# Patient Record
Sex: Male | Born: 1990 | Race: Black or African American | Hispanic: No | Marital: Single | Smoking: Former smoker
Health system: Southern US, Community
[De-identification: ages and names within clinical notes are randomized; demographics above are authoritative.]

## PROBLEM LIST (undated history)

## (undated) HISTORY — PX: WISDOM TOOTH EXTRACTION: SHX21

---
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
## 2013-01-16 ENCOUNTER — Emergency Department (HOSPITAL_BASED_OUTPATIENT_CLINIC_OR_DEPARTMENT_OTHER)
Admission: EM | Admit: 2013-01-16 | Discharge: 2013-01-16 | Disposition: A | Discharge status: Home / Self Care | Payer: Medicaid Other | Attending: Emergency Medicine | Admitting: Emergency Medicine

## 2013-01-16 ENCOUNTER — Encounter (HOSPITAL_BASED_OUTPATIENT_CLINIC_OR_DEPARTMENT_OTHER): Payer: Self-pay | Admitting: *Deleted

## 2013-01-16 DIAGNOSIS — Z8619 Personal history of other infectious and parasitic diseases: Secondary | ICD-10-CM | POA: Insufficient documentation

## 2013-01-16 DIAGNOSIS — N342 Other urethritis: Secondary | ICD-10-CM | POA: Insufficient documentation

## 2013-01-16 DIAGNOSIS — F17200 Nicotine dependence, unspecified, uncomplicated: Secondary | ICD-10-CM | POA: Insufficient documentation

## 2013-01-16 LAB — URINALYSIS, ROUTINE W REFLEX MICROSCOPIC
Bilirubin Urine: NEGATIVE
Glucose, UA: NEGATIVE mg/dL
Ketones, ur: NEGATIVE mg/dL
Nitrite: NEGATIVE
Protein, ur: NEGATIVE mg/dL
Specific Gravity, Urine: 1.024 (ref 1.005–1.030)
Urobilinogen, UA: 1 mg/dL (ref 0.0–1.0)
pH: 7 (ref 5.0–8.0)

## 2013-01-16 LAB — URINE MICROSCOPIC-ADD ON

## 2013-01-16 MED ORDER — LIDOCAINE HCL (PF) 1 % IJ SOLN
INTRAMUSCULAR | Status: AC
  Administered 2013-01-16: 0.9 mL
  Filled 2013-01-16: qty 5

## 2013-01-16 MED ORDER — CEFTRIAXONE SODIUM 250 MG IJ SOLR
250.0000 mg | Freq: Once | INTRAMUSCULAR | Status: AC
  Administered 2013-01-16: 250 mg via INTRAMUSCULAR
  Filled 2013-01-16: qty 250

## 2013-01-16 MED ORDER — AZITHROMYCIN 250 MG PO TABS
1000.0000 mg | ORAL_TABLET | Freq: Once | ORAL | Status: AC
  Administered 2013-01-16: 1000 mg via ORAL
  Filled 2013-01-16: qty 4

## 2013-01-16 NOTE — ED Provider Notes (Signed)
  CSN: [id number]     Arrival date & time 01/16/13  1703 History     First MD Initiated Contact with Patient 01/16/13 1720     No chief complaint on file.  (Consider location/radiation/quality/duration/timing/severity/associated sxs/prior Treatment) HPI Comments: Pt states that he had white discharge that started last night:pt has a history of chlamydia:pt denies fever, abdominal pain  The history is provided by the patient. No language interpreter was used.    History reviewed. No pertinent past medical history. History reviewed. No pertinent past surgical history. No family history on file. History  Substance Use Topics  . Smoking status: Current Every Day Smoker -- 0.50 packs/day    Types: Cigarettes  . Smokeless tobacco: Not on file  . Alcohol Use: Yes    Review of Systems  Constitutional: Negative.   Respiratory: Negative.   Cardiovascular: Negative.     Allergies  Review of patient's allergies indicates no known allergies.  Home Medications  No current outpatient prescriptions on file. BP 148/69  Pulse 52  Temp(Src) 98.6 F (37 C) (Oral)  Resp 20  Ht 5\' 11"  (1.803 m)  Wt 201 lb (91.173 kg)  BMI 28.05 kg/m2  SpO2 100% Physical Exam  Nursing note and vitals reviewed. Constitutional: He appears well-developed and well-nourished.  Cardiovascular: Normal rate and regular rhythm.   Pulmonary/Chest: Effort normal and breath sounds normal.  Genitourinary:  No discharge noted as pt has already urinated  Musculoskeletal: Normal range of motion.  Neurological: He is alert.    ED Course   Procedures (including critical care time)  Labs Reviewed  URINALYSIS, ROUTINE W REFLEX MICROSCOPIC - Abnormal; Notable for the following:    APPearance CLOUDY (*)    Hgb urine dipstick SMALL (*)    Leukocytes, UA LARGE (*)    All other components within normal limits  URINE MICROSCOPIC-ADD ON - Abnormal; Notable for the following:    Bacteria, UA FEW (*)    All other  components within normal limits  GC/CHLAMYDIA PROBE AMP  URINE CULTURE   No results found. 1. Urethritis     MDM  Pt treated for std  Teressa Lower, NP 01/16/13 1811

## 2013-01-16 NOTE — ED Notes (Signed)
Penile discharge since last night.  

## 2013-01-16 NOTE — ED Notes (Signed)
Deliah Boston, NP chaperoned for STD swab. Pt tolerated well.

## 2013-01-16 NOTE — ED Provider Notes (Signed)
Medical screening examination/treatment/procedure(s) were performed by non-physician practitioner and as supervising physician I was immediately available for consultation/collaboration.  Lyanne Co, MD 01/16/13 [phone]

## 2013-01-17 LAB — GC/CHLAMYDIA PROBE AMP
CT Probe RNA: NEGATIVE
GC Probe RNA: POSITIVE — AB

## 2013-01-17 LAB — URINE CULTURE
Colony Count: NO GROWTH
Culture: NO GROWTH

## 2013-01-18 ENCOUNTER — Telehealth (HOSPITAL_COMMUNITY): Payer: Self-pay | Admitting: Emergency Medicine

## 2013-01-18 NOTE — ED Notes (Signed)
+  Gonorrhea. Patient treated with Rocephin and Zithromax. [facility] faxed. 

## 2013-01-18 NOTE — ED Notes (Signed)
Patient has +Gonorrhea. °

## 2013-01-21 ENCOUNTER — Telehealth (HOSPITAL_COMMUNITY): Payer: Self-pay | Admitting: Emergency Medicine

## 2013-10-22 ENCOUNTER — Emergency Department (HOSPITAL_COMMUNITY)
Admission: EM | Admit: 2013-10-22 | Discharge: 2013-10-22 | Disposition: A | Discharge status: Home / Self Care | Payer: Medicaid Other | Attending: Emergency Medicine | Admitting: Emergency Medicine

## 2013-10-22 ENCOUNTER — Encounter (HOSPITAL_COMMUNITY): Payer: Self-pay | Admitting: Emergency Medicine

## 2013-10-22 DIAGNOSIS — Y929 Unspecified place or not applicable: Secondary | ICD-10-CM | POA: Insufficient documentation

## 2013-10-22 DIAGNOSIS — Y9389 Activity, other specified: Secondary | ICD-10-CM | POA: Insufficient documentation

## 2013-10-22 DIAGNOSIS — S39012A Strain of muscle, fascia and tendon of lower back, initial encounter: Secondary | ICD-10-CM

## 2013-10-22 DIAGNOSIS — S139XXA Sprain of joints and ligaments of unspecified parts of neck, initial encounter: Secondary | ICD-10-CM | POA: Insufficient documentation

## 2013-10-22 DIAGNOSIS — F17200 Nicotine dependence, unspecified, uncomplicated: Secondary | ICD-10-CM | POA: Insufficient documentation

## 2013-10-22 DIAGNOSIS — X500XXA Overexertion from strenuous movement or load, initial encounter: Secondary | ICD-10-CM | POA: Insufficient documentation

## 2013-10-22 MED ORDER — NAPROXEN 500 MG PO TABS: 500.0000 mg | ORAL_TABLET | Freq: Two times a day (BID) | ORAL | Status: DC

## 2013-10-22 MED ORDER — CYCLOBENZAPRINE HCL 10 MG PO TABS: 10.0000 mg | ORAL_TABLET | Freq: Two times a day (BID) | ORAL | Status: DC | PRN

## 2013-10-22 NOTE — ED Provider Notes (Signed)
CSN: [id number]     Arrival date & time 10/22/13  1551 History  This chart was scribed for non-physician practitioner Felicie Mornavid Paz Winsett, NP working with Gerhard Munchobert Lockwood, MD by Dorothey Basemania Sutton, ED Scribe. This patient was seen in room TR04C/TR04C and the patient's care was started at 5:14 PM.    Chief Complaint  Patient presents with  . Back Pain   Patient is a 23 y.o. male presenting with back pain. The history is provided by the patient. No language interpreter was used.  Back Pain Location:  Lumbar spine Radiates to:  Does not radiate Pain severity:  Moderate Onset quality:  Gradual Timing:  Constant Progression:  Waxing and waning Chronicity:  New Context: lifting heavy objects   Worsened by:  Movement Ineffective treatments: Icy Hot. Associated symptoms: no bladder incontinence, no bowel incontinence and no numbness    HPI Comments: Gavin Peterson is a 23 y.o. male who presents to the Emergency Department complaining of a waxing and waning pain to the lower back that he states has been ongoing for the past few months after some heavy lifting. Patient denies any pain radiation. He states that the pain is exacerbated with certain movements. Patient reports using topical Icy Hot at home without significant relief. He denies numbness, bowel or bladder incontinence. He denies any allergies to medications. Patient has no other pertinent medical history.   History reviewed. No pertinent past medical history. History reviewed. No pertinent past surgical history. History reviewed. No pertinent family history. History  Substance Use Topics  . Smoking status: Current Every Day Smoker -- 0.50 packs/day    Types: Cigarettes  . Smokeless tobacco: Not on file  . Alcohol Use: Yes    Review of Systems  Gastrointestinal: Negative for bowel incontinence.  Genitourinary: Negative for bladder incontinence.  Musculoskeletal: Positive for back pain.  Neurological: Negative for numbness.  All other systems  reviewed and are negative.     Allergies  Review of patient's allergies indicates no known allergies.  Home Medications   Prior to Admission medications   Not on File   Triage Vitals: BP 154/72  Pulse 64  Temp(Src) 98.1 F (36.7 C) (Oral)  Resp 18  Wt 220 lb (99.791 kg)  SpO2 100%  Physical Exam  Nursing note and vitals reviewed. Constitutional: He is oriented to person, place, and time. He appears well-developed and well-nourished. No distress.  HENT:  Head: Normocephalic and atraumatic.  Eyes: Conjunctivae are normal.  Neck: Normal range of motion. Neck supple.  Cardiovascular: Normal rate, regular rhythm and normal heart sounds.   Pulmonary/Chest: Effort normal and breath sounds normal. No respiratory distress.  Abdominal: He exhibits no distension.  Musculoskeletal: Normal range of motion.  Tenderness to palpation to lumbar paraspinal muscles.   Neurological: He is alert and oriented to person, place, and time.  Normal strength against resistance of bilateral upper and lower extremities. Normal heel-to-shin testing bilaterally.   Skin: Skin is warm and dry.  Psychiatric: He has a normal mood and affect. His behavior is normal.    ED Course  Procedures (including critical care time)  DIAGNOSTIC STUDIES: Oxygen Saturation is 100% on room air, normal by my interpretation.    COORDINATION OF CARE: 5:18 PM-Discussed that symptoms are likely muscular in nature. Will discharge patient with anti-inflammatories and muscle relaxants to manage symptoms. Advised of further symptomatic care at home and to follow up with the referred resources to help him find a PCP. Discussed treatment plan with patient at bedside  and patient verbalized agreement.     Labs Review Labs Reviewed - No data to display  Imaging Review No results found.   EKG Interpretation None      MDM   Final diagnoses:  None    Low back pain.  No red flag symptoms.  I personally performed the  services described in this documentation, which was scribed in my presence. The recorded information has been reviewed and is accurate.     Jimmye Normanavid John Darian Cansler, NP 10/22/13 [phone]

## 2013-10-22 NOTE — ED Notes (Signed)
Presents with lower back pain ongoing for the last few months, worse at certain times than others. Pain is worse with movement. denis nu,bness, tingling, denies urinary symptoms.

## 2013-10-22 NOTE — Discharge Instructions (Signed)
°Lumbosacral Strain °Lumbosacral strain is a strain of any of the parts that make up your lumbosacral vertebrae. Your lumbosacral vertebrae are the bones that make up the lower third of your backbone. Your lumbosacral vertebrae are held together by muscles and tough, fibrous tissue (ligaments).  °CAUSES  °A sudden blow to your back can cause lumbosacral strain. Also, anything that causes an excessive stretch of the muscles in the low back can cause this strain. This is typically seen when people exert themselves strenuously, fall, lift heavy objects, bend, or crouch repeatedly. °RISK FACTORS °· Physically demanding work. °· Participation in pushing or pulling sports or sports that require sudden twist of the back (tennis, golf, baseball). °· Weight lifting. °· Excessive lower back curvature. °· Forward-tilted pelvis. °· Weak back or abdominal muscles or both. °· Tight hamstrings. °SIGNS AND SYMPTOMS  °Lumbosacral strain may cause pain in the area of your injury or pain that moves (radiates) down your leg.  °DIAGNOSIS °Your health care provider can often diagnose lumbosacral strain through a physical exam. In some cases, you may need tests such as X-ray exams.  °TREATMENT  °Treatment for your lower back injury depends on many factors that your clinician will have to evaluate. However, most treatment will include the use of anti-inflammatory medicines. °HOME CARE INSTRUCTIONS  °· Avoid hard physical activities (tennis, racquetball, waterskiing) if you are not in proper physical condition for it. This may aggravate or create problems. °· If you have a back problem, avoid sports requiring sudden body movements. Swimming and walking are generally safer activities. °· Maintain good posture. °· Maintain a healthy weight. °· For acute conditions, you may put ice on the injured area. °· Put ice in a plastic bag. °· Place a towel between your skin and the bag. °· Leave the ice on for 20 minutes, 2 3 times a day. °· When the  low back starts healing, stretching and strengthening exercises may be recommended. °SEEK MEDICAL CARE IF: °· Your back pain is getting worse. °· You experience severe back pain not relieved with medicines. °SEEK IMMEDIATE MEDICAL CARE IF:  °· You have numbness, tingling, weakness, or problems with the use of your arms or legs. °· There is a change in bowel or bladder control. °· You have increasing pain in any area of the body, including your belly (abdomen). °· You notice shortness of breath, dizziness, or feel faint. °· You feel sick to your stomach (nauseous), are throwing up (vomiting), or become sweaty. °· You notice discoloration of your toes or legs, or your feet get very cold. °MAKE SURE YOU:  °· Understand these instructions. °· Will watch your condition. °· Will get help right away if you are not doing well or get worse. °Document Released: 03/10/2005 Document Revised: 03/21/2013 Document Reviewed: 01/17/2013 °Exit[employer]® Patient Information ©2014 [employer]. ° °Back Exercises °Back exercises help treat and prevent back injuries. The goal of back exercises is to increase the strength of your abdominal and back muscles and the flexibility of your back. These exercises should be started when you no longer have back pain. Back exercises include: °· Pelvic Tilt. Lie on your back with your knees bent. Tilt your pelvis until the lower part of your back is against the floor. Hold this position 5 to 10 sec and repeat 5 to 10 times. °· Knee to Chest. Pull first 1 knee up against your chest and hold for 20 to 30 seconds, repeat this with the other knee, and then both knees.   This may be done with the other leg straight or bent, whichever feels better. °· Sit-Ups or Curl-Ups. Bend your knees 90 degrees. Start with tilting your pelvis, and do a partial, slow sit-up, lifting your trunk only 30 to 45 degrees off the floor. Take at least 2 to 3 seconds for each sit-up. Do not do sit-ups with your knees out straight. If  partial sit-ups are difficult, simply do the above but with only tightening your abdominal muscles and holding it as directed. °· Hip-Lift. Lie on your back with your knees flexed 90 degrees. Push down with your feet and shoulders as you raise your hips a couple inches off the floor; hold for 10 seconds, repeat 5 to 10 times. °· Back arches. Lie on your stomach, propping yourself up on bent elbows. Slowly press on your hands, causing an arch in your low back. Repeat 3 to 5 times. Any initial stiffness and discomfort should lessen with repetition over time. °· Shoulder-Lifts. Lie face down with arms beside your body. Keep hips and torso pressed to floor as you slowly lift your head and shoulders off the floor. °Do not overdo your exercises, especially in the beginning. Exercises may cause you some mild back discomfort which lasts for a few minutes; however, if the pain is more severe, or lasts for more than 15 minutes, do not continue exercises until you see your caregiver. Improvement with exercise therapy for back problems is slow.  °See your caregivers for assistance with developing a proper back exercise program. °Document Released: 07/08/2004 Document Revised: 08/23/2011 Document Reviewed: 04/01/2011 °ExitCare® Patient Information ©2014 [employer]. ° °

## 2013-10-22 NOTE — ED Notes (Signed)
Name called no answer 

## 2013-10-23 NOTE — ED Provider Notes (Signed)
  Medical screening examination/treatment/procedure(s) were performed by non-physician practitioner and as supervising physician I was immediately available for consultation/collaboration.   EKG Interpretation None         Kai Calico, MD 10/23/13 0015 

## 2014-12-07 ENCOUNTER — Emergency Department (HOSPITAL_COMMUNITY)
Admission: EM | Admit: 2014-12-07 | Discharge: 2014-12-07 | Disposition: A | Discharge status: Home / Self Care | Payer: Medicaid Other | Attending: Emergency Medicine | Admitting: Emergency Medicine

## 2014-12-07 ENCOUNTER — Emergency Department (HOSPITAL_COMMUNITY): Payer: Medicaid Other

## 2014-12-07 ENCOUNTER — Encounter (HOSPITAL_COMMUNITY): Payer: Self-pay | Admitting: *Deleted

## 2014-12-07 DIAGNOSIS — M4806 Spinal stenosis, lumbar region: Secondary | ICD-10-CM | POA: Insufficient documentation

## 2014-12-07 DIAGNOSIS — W2209XA Striking against other stationary object, initial encounter: Secondary | ICD-10-CM | POA: Insufficient documentation

## 2014-12-07 DIAGNOSIS — Y9389 Activity, other specified: Secondary | ICD-10-CM | POA: Insufficient documentation

## 2014-12-07 DIAGNOSIS — S3992XA Unspecified injury of lower back, initial encounter: Secondary | ICD-10-CM | POA: Insufficient documentation

## 2014-12-07 DIAGNOSIS — Z8719 Personal history of other diseases of the digestive system: Secondary | ICD-10-CM | POA: Insufficient documentation

## 2014-12-07 DIAGNOSIS — Y990 Civilian activity done for income or pay: Secondary | ICD-10-CM | POA: Insufficient documentation

## 2014-12-07 DIAGNOSIS — Y9289 Other specified places as the place of occurrence of the external cause: Secondary | ICD-10-CM | POA: Insufficient documentation

## 2014-12-07 DIAGNOSIS — G8929 Other chronic pain: Secondary | ICD-10-CM | POA: Insufficient documentation

## 2014-12-07 DIAGNOSIS — Z720 Tobacco use: Secondary | ICD-10-CM | POA: Insufficient documentation

## 2014-12-07 DIAGNOSIS — Z791 Long term (current) use of non-steroidal anti-inflammatories (NSAID): Secondary | ICD-10-CM | POA: Insufficient documentation

## 2014-12-07 DIAGNOSIS — M9979 Connective tissue and disc stenosis of intervertebral foramina of abdomen and other regions: Secondary | ICD-10-CM

## 2014-12-07 MED ORDER — PREDNISONE 50 MG PO TABS: 50.0000 mg | ORAL_TABLET | Freq: Every day | ORAL | Status: DC

## 2014-12-07 MED ORDER — GUAIFENESIN ER 1200 MG PO TB12: 1.0000 | ORAL_TABLET | Freq: Two times a day (BID) | ORAL | Status: DC

## 2014-12-07 MED ORDER — ACETAMINOPHEN-CODEINE 120-12 MG/5ML PO SOLN: 10.0000 mL | ORAL | Status: DC | PRN

## 2014-12-07 MED ORDER — HYDROCODONE-ACETAMINOPHEN 5-325 MG PO TABS: 1.0000 | ORAL_TABLET | Freq: Four times a day (QID) | ORAL | Status: DC | PRN

## 2014-12-07 MED ORDER — IBUPROFEN 400 MG PO TABS
800.0000 mg | ORAL_TABLET | Freq: Once | ORAL | Status: AC
  Administered 2014-12-07: 800 mg via ORAL
  Filled 2014-12-07: qty 2

## 2014-12-07 NOTE — Discharge Instructions (Signed)
Return here as needed.  The x-rays showed that she had some narrowing of the disc space in the lumbar region going to follow up with the Dr. Provided.  This narrowing could be causing your discomfort

## 2014-12-07 NOTE — ED Notes (Signed)
Pt states hurt back in the past and then through out his back last nite. No incontinence

## 2014-12-07 NOTE — ED Provider Notes (Signed)
CSN: [id number]     Arrival date & time 12/07/14  1331 History  This chart was scribed for non-physician practitioner, Ebbie Ridge, PA-C,working with Benjiman Core, MD, by Karle Plumber, ED Scribe. This patient was seen in room TR05C/TR05C and the patient's care was started at 2:46 PM.  Chief Complaint  Patient presents with  . Back Pain   The history is provided by the patient and medical records. No language interpreter was used.    HPI Comments:  Gavin Peterson is a 24 y.o. male with PMHx of chronic back pain who presents to the Emergency Department complaining of severe low back pain that began last night. He reports initially hurting his back at a previous job. He states after he got off his third shift job last night he was experiencing pain. Upon waking this afternoon the pain had worsened. He denies taking anything to treat the pain. He denies modifying factors of the pain. He denies numbness, tingling or weakness of the lower extremities, bowel or bladder incontinence or saddle anesthesia. He denies any trauma, injury or fall.   History reviewed. No pertinent past medical history. Past Surgical History  Procedure Laterality Date  . Wisdom tooth extraction     No family history on file. History  Substance Use Topics  . Smoking status: Current Every Day Smoker -- 0.50 packs/day    Types: Cigarettes  . Smokeless tobacco: Not on file  . Alcohol Use: Yes     Comment: rarely    Review of Systems  Genitourinary:       No saddle anesthesia. No bowel or bladder incontinence.  Musculoskeletal: Positive for back pain.  Skin: Negative for color change and wound.  Neurological: Negative for weakness and numbness.    Allergies  Review of patient's allergies indicates no known allergies.  Home Medications   Prior to Admission medications   Medication Sig Start Date End Date Taking? Authorizing Provider  acetaminophen (TYLENOL) 500 MG tablet Take 1,000 mg by mouth daily as  needed (for back pain).    Historical Provider, MD  cyclobenzaprine (FLEXERIL) 10 MG tablet Take 1 tablet (10 mg total) by mouth 2 (two) times daily as needed for muscle spasms. 10/22/13   Felicie Morn, NP  Menthol, Topical Analgesic, (ICY HOT EX) Apply 1 application topically daily as needed (for back pain).    Historical Provider, MD  naproxen (NAPROSYN) 500 MG tablet Take 1 tablet (500 mg total) by mouth 2 (two) times daily. 10/22/13   Felicie Morn, NP   Triage Vitals: BP 153/76 mmHg  Pulse 87  Temp(Src) 98.3 F (36.8 C) (Oral)  Resp 16  Ht 6' (1.829 m)  Wt 240 lb (108.863 kg)  BMI 32.54 kg/m2  SpO2 95% Physical Exam  Constitutional: He is oriented to person, place, and time. He appears well-developed and well-nourished.  HENT:  Head: Normocephalic and atraumatic.  Neck: Normal range of motion.  Cardiovascular: Normal rate, regular rhythm and normal heart sounds.   Pulmonary/Chest: Effort normal and breath sounds normal.  Musculoskeletal: Normal range of motion. He exhibits tenderness.  Tenderness across lumbar region bilaterally and midline.  Neurological: He is alert and oriented to person, place, and time. He has normal reflexes. He exhibits normal muscle tone. Coordination normal.  Skin: Skin is warm and dry.  Psychiatric: He has a normal mood and affect. His behavior is normal.  Nursing note and vitals reviewed.   ED Course  Procedures (including critical care time) DIAGNOSTIC STUDIES: Oxygen Saturation is  95% on RA, adequate by my interpretation.   COORDINATION OF CARE: 2:52 PM- Will X-Ray lumbar spine. Pt verbalizes understanding and agrees to plan.  Patient will need referral to neurosurgery for further evaluation and care.  The patient most likely has a chronic condition of the lower back is causing these issues.  She has no neurological deficits noted on exam Imaging Review Dg Lumbar Spine Complete  12/07/2014   CLINICAL DATA:  Injury to lower back while working at  [employer] 1 year ago. Recurrence of pain a few days ago.  EXAM: LUMBAR SPINE - COMPLETE 4+ VIEW  COMPARISON:  None.  FINDINGS: Vertebral body alignment and heights are normal. There is subtle disc space narrowing at the L3-4 level. There is no compression fracture or subluxation. Remainder of the exam is within normal.  IMPRESSION: No acute findings.  Mild disc space narrowing at the L3-4 level.   Electronically Signed   By: Elberta Fortis M.D.   On: 12/07/2014 15:40      I personally performed the services described in this documentation, which was scribed in my presence. The recorded information has been reviewed and is accurate.    Charlestine Night, PA-C 12/08/14 0700  Benjiman Core, MD 12/08/14 [phone]

## 2014-12-07 NOTE — ED Notes (Signed)
Declined W/C at D/C and was escorted to lobby by RN. 

## 2018-03-14 ENCOUNTER — Other Ambulatory Visit: Payer: Self-pay

## 2018-03-14 ENCOUNTER — Emergency Department (HOSPITAL_BASED_OUTPATIENT_CLINIC_OR_DEPARTMENT_OTHER)
Admission: EM | Admit: 2018-03-14 | Discharge: 2018-03-14 | Disposition: A | Discharge status: Home / Self Care | Payer: Self-pay | Attending: Emergency Medicine | Admitting: Emergency Medicine

## 2018-03-14 ENCOUNTER — Encounter (HOSPITAL_BASED_OUTPATIENT_CLINIC_OR_DEPARTMENT_OTHER): Payer: Self-pay

## 2018-03-14 DIAGNOSIS — B9789 Other viral agents as the cause of diseases classified elsewhere: Secondary | ICD-10-CM | POA: Insufficient documentation

## 2018-03-14 DIAGNOSIS — J069 Acute upper respiratory infection, unspecified: Secondary | ICD-10-CM

## 2018-03-14 DIAGNOSIS — Z79899 Other long term (current) drug therapy: Secondary | ICD-10-CM | POA: Insufficient documentation

## 2018-03-14 DIAGNOSIS — F17210 Nicotine dependence, cigarettes, uncomplicated: Secondary | ICD-10-CM | POA: Insufficient documentation

## 2018-03-14 MED ORDER — BENZONATATE 100 MG PO CAPS: 100.0000 mg | ORAL_CAPSULE | Freq: Three times a day (TID) | ORAL | 0 refills | Status: DC

## 2018-03-14 MED ORDER — IBUPROFEN 600 MG PO TABS: 600.0000 mg | ORAL_TABLET | Freq: Four times a day (QID) | ORAL | 0 refills | Status: DC | PRN

## 2018-03-14 NOTE — ED Triage Notes (Signed)
C/o flu like sx x 3 days-NAD-steady gait 

## 2018-03-14 NOTE — ED Provider Notes (Signed)
[facility] EMERGENCY DEPARTMENT Provider Note   CSN: [id number] Arrival date & time: 03/14/18  1359     History   Chief Complaint Chief Complaint  Patient presents with  . Cough    HPI Gavin Peterson is a 27 y.o. male.  The history is provided by the patient. No language interpreter was used.  Cough      27 year old male presenting with flulike symptoms.  Patient report for the past 3 days he has had sinus congestion, postnasal drip, mild throat irritation, nonproductive cough, and mild chest discomfort.  Symptom is waxing and waning no specific treatment tried.  No report of fever, chest pain, shortness of breath, abdominal cramping, nausea vomiting diarrhea or rash.  His kids is having similar symptom.  History reviewed. No pertinent past medical history.  There are no active problems to display for this patient.   Past Surgical History:  Procedure Laterality Date  . WISDOM TOOTH EXTRACTION          Home Medications    Prior to Admission medications   Medication Sig Start Date End Date Taking? Authorizing Provider  acetaminophen (TYLENOL) 500 MG tablet Take 1,000 mg by mouth daily as needed (for back pain).    [provider]  cyclobenzaprine (FLEXERIL) 10 MG tablet Take 1 tablet (10 mg total) by mouth 2 (two) times daily as needed for muscle spasms. 10/22/13   Felicie Morn, NP  HYDROcodone-acetaminophen (NORCO/VICODIN) 5-325 MG per tablet Take 1 tablet by mouth every 6 (six) hours as needed for moderate pain. 12/07/14   Lawyer, Cristal Deer, PA-C  Menthol, Topical Analgesic, (ICY HOT EX) Apply 1 application topically daily as needed (for back pain).    [provider]  naproxen (NAPROSYN) 500 MG tablet Take 1 tablet (500 mg total) by mouth 2 (two) times daily. 10/22/13   Felicie Morn, NP  predniSONE (DELTASONE) 50 MG tablet Take 1 tablet (50 mg total) by mouth daily. 12/07/14   Charlestine Night, PA-C    Family History No family  history on file.  Social History Social History   Tobacco Use  . Smoking status: Current Every Day Smoker    Packs/day: 0.50    Types: Cigarettes  . Smokeless tobacco: Never Used  Substance Use Topics  . Alcohol use: Not Currently  . Drug use: Not Currently    Types: Marijuana     Allergies   Patient has no known allergies.   Review of Systems Review of Systems  Respiratory: Positive for cough.   All other systems reviewed and are negative.    Physical Exam Updated Vital Signs BP (!) 156/85 (BP Location: Right Arm)   Pulse 60   Temp 98.4 F (36.9 C) (Oral)   Resp 18   Ht 6' (1.829 m)   Wt 114.8 kg   SpO2 99%   BMI 34.31 kg/m   Physical Exam  Constitutional: He appears well-developed and well-nourished. No distress.  HENT:  Head: Atraumatic.  Ears: Normal TMs bilaterally Nose: Normal nares Throat: Postnasal drip noted.  Uvula midline no tonsillar enlargement or exudates, no trismus  Eyes: Conjunctivae are normal.  Neck: Normal range of motion. Neck supple. No JVD present. No tracheal deviation present.  No nuchal rigidity  Cardiovascular: Normal rate and regular rhythm.  Pulmonary/Chest: Effort normal and breath sounds normal. No stridor. No respiratory distress. He has no wheezes. He has no rales. He exhibits no tenderness.  Abdominal: Soft. He exhibits no distension. There is no tenderness.  Lymphadenopathy:  He has no cervical adenopathy.  Neurological: He is alert.  Skin: No rash noted.  Psychiatric: He has a normal mood and affect.  Nursing note and vitals reviewed.    ED Treatments / Results  Labs (all labs ordered are listed, but only abnormal results are displayed) Labs Reviewed - No data to display  EKG None  Radiology No results found.  Procedures Procedures (including critical care time)  Medications Ordered in ED Medications - No data to display   Initial Impression / Assessment and Plan / ED Course  I have reviewed the  triage vital signs and the nursing notes.  Pertinent labs & imaging results that were available during my care of the patient were reviewed by me and considered in my medical decision making (see chart for details).     BP (!) 156/85 (BP Location: Right Arm)   Pulse 60   Temp 98.4 F (36.9 C) (Oral)   Resp 18   Ht 6' (1.829 m)   Wt 114.8 kg   SpO2 99%   BMI 34.31 kg/m    Final Clinical Impressions(s) / ED Diagnoses   Final diagnoses:  Viral upper respiratory tract infection    ED Discharge Orders         Ordered    ibuprofen (ADVIL,MOTRIN) 600 MG tablet  Every 6 hours PRN     03/14/18 1430    benzonatate (TESSALON) 100 MG capsule  Every 8 hours     03/14/18 1430         Pt symptoms consistent with URI. Pt will be discharged with symptomatic treatment.  Discussed return precautions.  Pt is hemodynamically stable & in NAD prior to discharge.    Fayrene Helper, PA-C 03/14/18 1430    Pricilla Loveless, MD 03/14/18 [phone]

## 2018-04-10 ENCOUNTER — Emergency Department (HOSPITAL_BASED_OUTPATIENT_CLINIC_OR_DEPARTMENT_OTHER): Payer: Self-pay

## 2018-04-10 ENCOUNTER — Encounter (HOSPITAL_BASED_OUTPATIENT_CLINIC_OR_DEPARTMENT_OTHER): Payer: Self-pay

## 2018-04-10 ENCOUNTER — Other Ambulatory Visit: Payer: Self-pay

## 2018-04-10 ENCOUNTER — Emergency Department (HOSPITAL_BASED_OUTPATIENT_CLINIC_OR_DEPARTMENT_OTHER)
Admission: EM | Admit: 2018-04-10 | Discharge: 2018-04-10 | Disposition: A | Discharge status: Home / Self Care | Payer: Self-pay | Attending: Emergency Medicine | Admitting: Emergency Medicine

## 2018-04-10 DIAGNOSIS — Z79899 Other long term (current) drug therapy: Secondary | ICD-10-CM | POA: Insufficient documentation

## 2018-04-10 DIAGNOSIS — F17210 Nicotine dependence, cigarettes, uncomplicated: Secondary | ICD-10-CM | POA: Insufficient documentation

## 2018-04-10 DIAGNOSIS — M25572 Pain in left ankle and joints of left foot: Secondary | ICD-10-CM | POA: Insufficient documentation

## 2018-04-10 NOTE — Discharge Instructions (Addendum)
You are seen in the ER for left ankle pain.  X-rays today are normal.  The cause of your pain is unclear but it may be from soft tissue overuse injury or inflammation.  Take acetaminophen or ibuprofen every 6 hours for the next 3 to 5 days.  Ice.  Return to the ER for swelling, redness, warmth to the ankle or calf pain or swelling.  Follow-up with podiatry (foot doctor) in the next 7 to 10 days if the symptoms do not improve.

## 2018-04-10 NOTE — ED Notes (Signed)
Pt verbalizes understanding of d/c instructions and denies any further needs at this time. 

## 2018-04-10 NOTE — ED Triage Notes (Signed)
C/o pain to left ankle x 2 weeks-denies injury-NAD-steady gait

## 2018-04-10 NOTE — ED Provider Notes (Signed)
[facility] EMERGENCY DEPARTMENT Provider Note   CSN: [id number] Arrival date & time: 04/10/18  1932     History   Chief Complaint Chief Complaint  Patient presents with  . Ankle Pain    HPI Gavin Peterson is a 27 y.o. male is in the ER for evaluation of left ankle pain onset 2 weeks ago.  Pain is mild to moderate, achy.  Pain is worse in the mornings and when he starts to work or walk on it.  States as he moves around and warms up his ankle the pain starts to improve.  Associated with mild swelling.  No interventions for this.  No previous history of injuries or surgeries or injections in this joint.  No history of gout.  No associated redness, warmth, calf pain or swelling, fevers.  HPI  History reviewed. No pertinent past medical history.  There are no active problems to display for this patient.   Past Surgical History:  Procedure Laterality Date  . WISDOM TOOTH EXTRACTION          Home Medications    Prior to Admission medications   Medication Sig Start Date End Date Taking? Authorizing Provider  acetaminophen (TYLENOL) 500 MG tablet Take 1,000 mg by mouth daily as needed (for back pain).    [provider]  benzonatate (TESSALON) 100 MG capsule Take 1 capsule (100 mg total) by mouth every 8 (eight) hours. 03/14/18   Fayrene Helper, PA-C  cyclobenzaprine (FLEXERIL) 10 MG tablet Take 1 tablet (10 mg total) by mouth 2 (two) times daily as needed for muscle spasms. 10/22/13   Felicie Morn, NP  HYDROcodone-acetaminophen (NORCO/VICODIN) 5-325 MG per tablet Take 1 tablet by mouth every 6 (six) hours as needed for moderate pain. 12/07/14   Lawyer, Cristal Deer, PA-C  ibuprofen (ADVIL,MOTRIN) 600 MG tablet Take 1 tablet (600 mg total) by mouth every 6 (six) hours as needed. 03/14/18   Fayrene Helper, PA-C  Menthol, Topical Analgesic, (ICY HOT EX) Apply 1 application topically daily as needed (for back pain).    [provider]  naproxen (NAPROSYN) 500 MG  tablet Take 1 tablet (500 mg total) by mouth 2 (two) times daily. 10/22/13   Felicie Morn, NP  predniSONE (DELTASONE) 50 MG tablet Take 1 tablet (50 mg total) by mouth daily. 12/07/14   Charlestine Night, PA-C    Family History No family history on file.  Social History Social History   Tobacco Use  . Smoking status: Current Every Day Smoker    Packs/day: 0.50    Types: Cigarettes  . Smokeless tobacco: Never Used  Substance Use Topics  . Alcohol use: Not Currently  . Drug use: Not Currently     Allergies   Patient has no known allergies.   Review of Systems Review of Systems  Musculoskeletal: Positive for arthralgias, gait problem and joint swelling.  All other systems reviewed and are negative.    Physical Exam Updated Vital Signs BP (!) 148/93 (BP Location: Left Arm)   Pulse 76   Temp 97.7 F (36.5 C) (Oral)   Resp 20   SpO2 99%   Physical Exam  Constitutional: He is oriented to person, place, and time. He appears well-developed and well-nourished.  Non-toxic appearance.  HENT:  Head: Normocephalic.  Right Ear: External ear normal.  Left Ear: External ear normal.  Nose: Nose normal.  Eyes: Conjunctivae and EOM are normal.  Neck: Full passive range of motion without pain.  Cardiovascular: Normal rate.  No  asymmetric calf edema or tenderness. 2+ Dp and PT pulses bilaterally  Pulmonary/Chest: Effort normal. No tachypnea. No respiratory distress.  Musculoskeletal: Normal range of motion. He exhibits tenderness.  Left ankle: mild anterior ankle tenderness w/o obvious edema. No overlaying erythema, warmth, fluctuance.  No focal tenderness to malleoli, calcaneous, achilles tendon or metatarsal.  Pain with inversion and eversion. No laxity.  Full dorsiflexion and plantar flexion against resistance without pain.   Neurological: He is alert and oriented to person, place, and time.  Sensation to light touch intact in feet bilaterally  Skin: Skin is warm and dry.  Capillary refill takes less than 2 seconds.  Psychiatric: His behavior is normal. Thought content normal.     ED Treatments / Results  Labs (all labs ordered are listed, but only abnormal results are displayed) Labs Reviewed - No data to display  EKG None  Radiology Dg Ankle Complete Left  Result Date: 04/10/2018 CLINICAL DATA:  Ankle pain for 2 weeks. EXAM: LEFT ANKLE COMPLETE - 3+ VIEW COMPARISON:  None. FINDINGS: No fracture deformity nor dislocation. The ankle mortise appears congruent and the tibiofibular syndesmosis intact. No destructive bony lesions. Soft tissue planes are non-suspicious. IMPRESSION: Negative. Electronically Signed   By: Awilda Metro M.D.   On: 04/10/2018 20:07    Procedures Procedures (including critical care time)  Medications Ordered in ED Medications - No data to display   Initial Impression / Assessment and Plan / ED Course  I have reviewed the triage vital signs and the nursing notes.  Pertinent labs & imaging results that were available during my care of the patient were reviewed by me and considered in my medical decision making (see chart for details).    27 year old here with atraumatic left ankle pain.  No fevers or chills.  No history of IV drug use or immunosuppression.  This is not a prosthetic joint.  No trauma.  No history of DVT or PE, asymmetric calf tenderness or edema.  No signs of overlying cellulitis.  He has only mild pain with inversion and eversion and I doubt septic arthritis.  No history of gout.  X-rays obtained today negative.  Likely soft tissue overuse type pain.  Will recommend symptomatic management.  Follow-up with podiatry in 1 to 2 weeks if symptoms are refractory.  Discussed return precautions.  Patient is in agreement.   Final Clinical Impressions(s) / ED Diagnoses   Final diagnoses:  Acute left ankle pain    ED Discharge Orders    None       Jerrell Mylar 04/10/18 2240    Jacalyn Lefevre, MD 04/10/18 2329

## 2019-10-18 ENCOUNTER — Emergency Department (HOSPITAL_BASED_OUTPATIENT_CLINIC_OR_DEPARTMENT_OTHER)
Admission: EM | Admit: 2019-10-18 | Discharge: 2019-10-18 | Disposition: A | Discharge status: Home / Self Care | Payer: Self-pay | Attending: Emergency Medicine | Admitting: Emergency Medicine

## 2019-10-18 ENCOUNTER — Other Ambulatory Visit: Payer: Self-pay

## 2019-10-18 ENCOUNTER — Encounter (HOSPITAL_BASED_OUTPATIENT_CLINIC_OR_DEPARTMENT_OTHER): Payer: Self-pay | Admitting: Emergency Medicine

## 2019-10-18 DIAGNOSIS — M545 Low back pain, unspecified: Secondary | ICD-10-CM

## 2019-10-18 DIAGNOSIS — F17210 Nicotine dependence, cigarettes, uncomplicated: Secondary | ICD-10-CM | POA: Insufficient documentation

## 2019-10-18 MED ORDER — NAPROXEN 375 MG PO TABS: ORAL_TABLET | ORAL | 0 refills | Status: AC

## 2019-10-18 MED ORDER — NAPROXEN 250 MG PO TABS
500.0000 mg | ORAL_TABLET | Freq: Once | ORAL | Status: AC
  Administered 2019-10-18: 500 mg via ORAL
  Filled 2019-10-18: qty 2

## 2019-10-18 MED ORDER — HYDROCODONE-ACETAMINOPHEN 5-325 MG PO TABS: 1.0000 | ORAL_TABLET | Freq: Four times a day (QID) | ORAL | 0 refills | Status: AC | PRN

## 2019-10-18 NOTE — ED Provider Notes (Signed)
[facility] Provider Note: [name], MD, FACEP  CSN: [id number] MRN: [id number] ARRIVAL: 10/18/19 at 0551 ROOM: [facility]05/[facility]   CHIEF COMPLAINT  Back Pain   HISTORY OF PRESENT ILLNESS  10/18/19 6:01 AM DARREK Peterson is a 29 y.o. male who has had episodic back pain since an injury in 2016.  He is here with an exacerbation of low back pain for the past 4 days.  He did not have an injury, heavy lifting or other known trigger.  The pain is located in the bilateral lower back, more prominent in the right lower back.  It does not radiate to his buttocks or legs.  It is sharp in nature, rated as an 8 out of 10 and worse with certain positions.  He has been taking BC powders without relief.  He is getting married in 2 days.   History reviewed. No pertinent past medical history.  Past Surgical History:  Procedure Laterality Date  . WISDOM TOOTH EXTRACTION      No family history on file.  Social History   Tobacco Use  . Smoking status: Current Every Day Smoker    Packs/day: 0.50    Types: Cigarettes  . Smokeless tobacco: Never Used  Substance Use Topics  . Alcohol use: Not Currently  . Drug use: Not Currently    Prior to Admission medications   Medication Sig Start Date End Date Taking? Authorizing Provider  HYDROcodone-acetaminophen (NORCO) 5-325 MG tablet Take 1 tablet by mouth every 6 (six) hours as needed for severe pain. 10/18/19   Caster Fayette, MD  naproxen (NAPROSYN) 375 MG tablet Take 1 tablet twice daily as needed for back pain. 10/18/19   Corinda Ammon, Jonny Ruiz, MD    Allergies Patient has no known allergies.   REVIEW OF SYSTEMS  Negative except as noted here or in the History of Present Illness.   PHYSICAL EXAMINATION  Initial Vital Signs Blood pressure (!) 153/95, pulse 68, temperature 97.8 F (36.6 C), temperature source Oral, resp. rate 16, height 5\' 11"  (1.803 m), weight 111.1 kg, SpO2 100 %.  Examination General: Well-developed, well-nourished male in  no acute distress; appearance consistent with age of record HENT: normocephalic; atraumatic Eyes: pupils equal, round and reactive to light; extraocular muscles intact Neck: supple Heart: regular rate and rhythm Lungs: clear to auscultation bilaterally Abdomen: soft; nondistended; nontender; bowel sounds present Back: Bilateral paralumbar tenderness, greater on the right Extremities: No deformity; full range of motion; pulses normal Neurologic: Awake, alert and oriented; motor function intact in all extremities and symmetric; no facial droop Skin: Warm and dry Psychiatric: Normal mood and affect   RESULTS  Summary of this visit's results, reviewed and interpreted by myself:   EKG Interpretation  Date/Time:    Ventricular Rate:    PR Interval:    QRS Duration:   QT Interval:    QTC Calculation:   R Axis:     Text Interpretation:        Laboratory Studies: No results found for this or any previous visit (from the past 24 hour(s)). Imaging Studies: No results found.  ED COURSE and MDM  Nursing notes, initial and subsequent vitals signs, including pulse oximetry, reviewed and interpreted by myself.  Vitals:   10/18/19 0559 10/18/19 0600  BP:  (!) 153/95  Pulse:  68  Resp:  16  Temp:  97.8 F (36.6 C)  TempSrc:  Oral  SpO2:  100%  Weight: 111.1 kg   Height: 5\' 11"  (1.803 m)  Medications  naproxen (NAPROSYN) tablet 500 mg (has no administration in time range)    Patient has no concerning neurologic signs or symptoms.  We will treat him with NSAIDs and a brief course of narcotics and refer to sports medicine if symptoms persist.  He was advised to quit smoking as smoking can contribute to chronic back pain.  PROCEDURES  Procedures   ED DIAGNOSES     ICD-10-CM   1. Acute bilateral low back pain without sciatica  M54.5        Chevelle Coulson, Jenny Reichmann, MD 10/18/19 [phone]

## 2019-10-18 NOTE — ED Triage Notes (Signed)
Pt c/o lower back pain. Pt reports remote hx of back injury.

## 2020-10-23 ENCOUNTER — Emergency Department (HOSPITAL_BASED_OUTPATIENT_CLINIC_OR_DEPARTMENT_OTHER)
Admission: EM | Admit: 2020-10-23 | Discharge: 2020-10-23 | Disposition: A | Discharge status: Home / Self Care | Payer: Self-pay | Attending: Emergency Medicine | Admitting: Emergency Medicine

## 2020-10-23 ENCOUNTER — Other Ambulatory Visit: Payer: Self-pay

## 2020-10-23 ENCOUNTER — Emergency Department (HOSPITAL_BASED_OUTPATIENT_CLINIC_OR_DEPARTMENT_OTHER): Payer: Self-pay

## 2020-10-23 ENCOUNTER — Encounter (HOSPITAL_BASED_OUTPATIENT_CLINIC_OR_DEPARTMENT_OTHER): Payer: Self-pay | Admitting: Emergency Medicine

## 2020-10-23 DIAGNOSIS — S61210A Laceration without foreign body of right index finger without damage to nail, initial encounter: Secondary | ICD-10-CM | POA: Insufficient documentation

## 2020-10-23 DIAGNOSIS — Z23 Encounter for immunization: Secondary | ICD-10-CM | POA: Insufficient documentation

## 2020-10-23 DIAGNOSIS — S6991XA Unspecified injury of right wrist, hand and finger(s), initial encounter: Secondary | ICD-10-CM

## 2020-10-23 DIAGNOSIS — Y93F2 Activity, caregiving, lifting: Secondary | ICD-10-CM | POA: Insufficient documentation

## 2020-10-23 DIAGNOSIS — W231XXA Caught, crushed, jammed, or pinched between stationary objects, initial encounter: Secondary | ICD-10-CM | POA: Insufficient documentation

## 2020-10-23 DIAGNOSIS — Y990 Civilian activity done for income or pay: Secondary | ICD-10-CM | POA: Insufficient documentation

## 2020-10-23 DIAGNOSIS — F17210 Nicotine dependence, cigarettes, uncomplicated: Secondary | ICD-10-CM | POA: Insufficient documentation

## 2020-10-23 MED ORDER — TETANUS-DIPHTH-ACELL PERTUSSIS 5-2.5-18.5 LF-MCG/0.5 IM SUSY
0.5000 mL | PREFILLED_SYRINGE | Freq: Once | INTRAMUSCULAR | Status: AC
  Administered 2020-10-23: 0.5 mL via INTRAMUSCULAR
  Filled 2020-10-23: qty 0.5

## 2020-10-23 MED ORDER — IBUPROFEN 400 MG PO TABS
600.0000 mg | ORAL_TABLET | Freq: Once | ORAL | Status: AC
  Administered 2020-10-23: 600 mg via ORAL
  Filled 2020-10-23: qty 1

## 2020-10-23 NOTE — ED Triage Notes (Signed)
Patient caught side of R index finger underneath slab of marble at work.

## 2020-10-23 NOTE — Discharge Instructions (Signed)
You have been seen today for a finger injury. There were no acute abnormalities on the x-rays, including no sign of fracture or dislocation, however, there could be injuries to the soft tissues, such as the ligaments or tendons that are not seen on xrays. There could also be what are called occult fractures that are small fractures not seen on xray. Antiinflammatory medications: Take 600 mg of ibuprofen every 6 hours or 440 mg (over the counter dose) to 500 mg (prescription dose) of naproxen every 12 hours for the next 3 days. After this time, these medications may be used as needed for pain. Take these medications with food to avoid upset stomach. Choose only one of these medications, do not take them together. Acetaminophen (generic for Tylenol): Should you continue to have additional pain while taking the ibuprofen or naproxen, you may add in acetaminophen as needed. Your daily total maximum amount of acetaminophen from all sources should be limited to 4000mg /day for persons without liver problems, or 2000mg /day for those with liver problems. Ice: May apply ice to the area over the next 24 hours for 15 minutes at a time to reduce swelling. Elevation: Keep the extremity elevated as often as possible to reduce pain and inflammation. Support: Wear the finger splint for support and comfort. Wear this until pain resolves.   Follow up: If symptoms are improving, you may follow up with your primary care provider for any continued management. If symptoms are not starting to improve within a week, you should follow up with the orthopedic specialist within two weeks. Return: Return to the ED for numbness, weakness, increasing pain, overall worsening symptoms, loss of function, or if symptoms are not improving, you have tried to follow up with the orthopedic specialist, and have been unable to do so.  For prescription assistance, may try using prescription discount sites or apps, such as goodrx.com or Good Rx smart  phone app.   Wound Care - Dermabond  Your wound has been closed with a medical-grade glue called Dermabond. Please adhere to the following wound care instructions:  You may shower, but avoid submerging the wound. Do not scrub the wound, as this may cause the glue to wear off prematurely.    The glue will wear off on its own, usually within 5-10 days. During this time period DO NOT apply antibiotic ointments or any other ointments or lotions to the area as this can cause the glue to wear off prematurely.  After 10 days, you may apply ointments, such as Neosporin, to the area to help the remaining glue to wear off.   After the wound has healed and the glue is gone, you may apply ointments such as Aquaphor to the wound to reduce scarring.  May use ibuprofen, naproxen, or Tylenol for pain.  Return to the ED should the wound edges come apart or signs of infection arise, such as spreading redness, puffiness/swelling, pus draining from the wound, severe increase in pain, or any other major issues.  For prescription assistance, may try using prescription discount sites or apps, such as goodrx.com

## 2020-10-23 NOTE — ED Provider Notes (Signed)
[facility] EMERGENCY DEPARTMENT Provider Note   CSN: [id number] Arrival date & time: 10/23/20  1225     History No chief complaint on file.   Gavin Peterson is a 30 y.o. male.  HPI     Gavin Peterson is a 30 y.o. male, presenting to the ED with injury to the right index finger that occurred shortly prior to arrival. Patient states he was carrying a piece of marble, set it down as gently as he could, but the side of his finger got caught underneath.  He was able to pull it out himself.  He endorses moderate, throbbing pain to the radial distal index finger.  Unknown last tetanus vaccination update. Denies numbness, weakness, other injuries.   History reviewed. No pertinent past medical history.  There are no problems to display for this patient.   Past Surgical History:  Procedure Laterality Date  . WISDOM TOOTH EXTRACTION         History reviewed. No pertinent family history.  Social History   Tobacco Use  . Smoking status: Current Every Day Smoker    Packs/day: 0.50    Types: Cigarettes  . Smokeless tobacco: Never Used  Substance Use Topics  . Alcohol use: Not Currently  . Drug use: Not Currently    Home Medications Prior to Admission medications   Medication Sig Start Date End Date Taking? Authorizing Provider  HYDROcodone-acetaminophen (NORCO) 5-325 MG tablet Take 1 tablet by mouth every 6 (six) hours as needed for severe pain. 10/18/19   Molpus, John, MD  naproxen (NAPROSYN) 375 MG tablet Take 1 tablet twice daily as needed for back pain. 10/18/19   Molpus, Jonny Ruiz, MD    Allergies    Patient has no known allergies.  Review of Systems   Review of Systems  Musculoskeletal: Positive for arthralgias.  Skin: Positive for wound.  Neurological: Negative for weakness and numbness.    Physical Exam Updated Vital Signs BP (!) 152/102 (BP Location: Right Arm)   Pulse (!) 51   Temp 98.4 F (36.9 C) (Oral)   Resp 16   Ht 5\' 11"  (1.803 m)   Wt  118.8 kg   SpO2 100%   BMI 36.54 kg/m   Physical Exam Vitals and nursing note reviewed.  Constitutional:      General: He is not in acute distress.    Appearance: He is well-developed. He is not diaphoretic.  HENT:     Head: Normocephalic and atraumatic.  Eyes:     Conjunctiva/sclera: Conjunctivae normal.  Cardiovascular:     Rate and Rhythm: Normal rate and regular rhythm.  Pulmonary:     Effort: Pulmonary effort is normal.  Musculoskeletal:     Cervical back: Neck supple.     Comments: Small, perhaps 0.25 cm laceration to the radial, distal right index finger, as shown in the photos. Nail appears to be intact and stable. Full flexion and extension intact in the DIP, PIP, and MCP joints of the finger. The edges of the wound spontaneously approximate and are unable to be separated with opposing force, therefore, I suspect the wound is more likely superficial. No pain, tenderness, swelling, deformity in the other fingers or in the hand.  Skin:    General: Skin is warm and dry.     Capillary Refill: Capillary refill takes less than 2 seconds.     Coloration: Skin is not pale.  Neurological:     Mental Status: He is alert.     Comments:  Sensation to light touch grossly intact in the right index finger as well as in the other fingers of the right hand. Flexion and extension intact against resistance at the DIP, PIP, and MCP joints of the index finger.  Psychiatric:        Behavior: Behavior normal.          ED Results / Procedures / Treatments   Labs (all labs ordered are listed, but only abnormal results are displayed) Labs Reviewed - No data to display  EKG None  Radiology DG Finger Index Right  Result Date: 10/23/2020 CLINICAL DATA:  Caused side of index finger underneath a SLAP of marble at work. EXAM: RIGHT INDEX FINGER 2+V COMPARISON:  None FINDINGS: No foreign body in the soft tissues. Mild distortion of the radial side of the nail bed may indicate injury to  this area, this is not clear. Phalanx incompletely imaged on lateral and oblique views. The base of the proximal phalanx is excluded on these views. No sign of fracture from the proximal diaphysis of the proximal phalanx through the distal aspect of the index finger. IMPRESSION: 1. No foreign body in the soft tissues. Mild distortion at the nail bed in the index finger could indicate soft tissue injury. No sign of fracture. 2. Base of the proximal phalanx of the index finger is excluded from view on the oblique and the lateral projection. If there is concern for injury in base of the proximal phalanx near the metacarpal phalangeal joint Electronically Signed   By: Donzetta Kohut M.D.   On: 10/23/2020 13:45    Procedures Procedures   Medications Ordered in ED Medications  ibuprofen (ADVIL) tablet 600 mg (600 mg Oral Given 10/23/20 1341)  Tdap (BOOSTRIX) injection 0.5 mL (0.5 mLs Intramuscular Given 10/23/20 1338)    ED Course  I have reviewed the triage vital signs and the nursing notes.  Pertinent labs & imaging results that were available during my care of the patient were reviewed by me and considered in my medical decision making (see chart for details).  Clinical Course as of 10/23/20 1451  Thu Oct 23, 2020  1313 Declined initial offer for digital block. [SJ]    Clinical Course User Index [SJ] Barbarita Hutmacher, Hillard Danker, PA-C   MDM Rules/Calculators/A&P                          Patient presents with injury to the right index finger with wound. No findings concerning for neurovascular compromise. I personally reviewed and interpreted the patient's x-ray.  No acute osseous abnormality noted. I discussed the wound and its management with the patient.  It is quite a small wound and the edges spontaneously approximate.  The wound edges do not separate with range of motion of the finger.  I educated the patient about the possibility for the wound opening further, especially with hard work on the hands.   I offered suture to the wound to provide additional strength.  Shared decision-making discussion.  Patient opted against suturing.  He will be excused from work for the rest of the day as well as tomorrow and off work for the weekend. The patient was given instructions for home care as well as return precautions. Patient voices understanding of these instructions, accepts the plan, and is comfortable with discharge.    Final Clinical Impression(s) / ED Diagnoses Final diagnoses:  Injury of finger of right hand, initial encounter    Rx / DC  Orders ED Discharge Orders    None       Concepcion Living 10/23/20 1453    Gwyneth Sprout, MD 11/05/20 0830

## 2021-07-14 ENCOUNTER — Encounter (HOSPITAL_BASED_OUTPATIENT_CLINIC_OR_DEPARTMENT_OTHER): Payer: Self-pay

## 2021-07-14 ENCOUNTER — Emergency Department (HOSPITAL_BASED_OUTPATIENT_CLINIC_OR_DEPARTMENT_OTHER): Payer: Self-pay

## 2021-07-14 ENCOUNTER — Emergency Department (HOSPITAL_BASED_OUTPATIENT_CLINIC_OR_DEPARTMENT_OTHER)
Admission: EM | Admit: 2021-07-14 | Discharge: 2021-07-14 | Disposition: A | Discharge status: Home / Self Care | Payer: Self-pay | Attending: Emergency Medicine | Admitting: Emergency Medicine

## 2021-07-14 ENCOUNTER — Other Ambulatory Visit: Payer: Self-pay

## 2021-07-14 DIAGNOSIS — M79671 Pain in right foot: Secondary | ICD-10-CM | POA: Insufficient documentation

## 2021-07-14 NOTE — ED Triage Notes (Addendum)
Pt arrives with pain to right foot states he felt like he jammed his toe, hitting it on his porch when walking, reports pain has gone into bottom of right foot. Has been using BC powders, heat, and compression. States this did offer some relief.  Pt states he does need a note for work.

## 2021-07-14 NOTE — Discharge Instructions (Signed)
You can use ibuprofen and Tylenol at home for pain as needed.  If you are still not able to bear full weight in 1 week, please schedule a follow-up appointment with an orthopedic doctor

## 2021-07-14 NOTE — ED Provider Notes (Signed)
[facility] EMERGENCY DEPARTMENT Provider Note   CSN: [id number] Arrival date & time: 07/14/21  1025     History  Chief Complaint  Patient presents with   Foot Pain    Gavin Peterson is a 31 y.o. male presenting to the ED with pain in his right foot.  He reports that he stubbed his toe and his foot against the porch yesterday.  He was having significant pain yesterday which improved today.  He is able to walk on it.  He does have pain along the mid aspect of the medial foot (metatarsal 1st).  No other injuries  HPI     Home Medications Prior to Admission medications   Medication Sig Start Date End Date Taking? Authorizing Provider  HYDROcodone-acetaminophen (NORCO) 5-325 MG tablet Take 1 tablet by mouth every 6 (six) hours as needed for severe pain. 10/18/19   Molpus, John, MD  naproxen (NAPROSYN) 375 MG tablet Take 1 tablet twice daily as needed for back pain. 10/18/19   Molpus, Jonny Ruiz, MD      Allergies    Patient has no known allergies.    Review of Systems   Review of Systems  Physical Exam Updated Vital Signs BP (!) 146/81 (BP Location: Left Arm)    Pulse 60    Temp 98.4 F (36.9 C) (Oral)    Resp 15    Ht 5\' 11"  (1.803 m)    Wt 99.8 kg    SpO2 100%    BMI 30.68 kg/m  Physical Exam Constitutional:      General: He is not in acute distress. HENT:     Head: Normocephalic and atraumatic.  Eyes:     Conjunctiva/sclera: Conjunctivae normal.     Pupils: Pupils are equal, round, and reactive to light.  Cardiovascular:     Rate and Rhythm: Normal rate and regular rhythm.     Pulses: Normal pulses.  Pulmonary:     Effort: Pulmonary effort is normal. No respiratory distress.  Musculoskeletal:     Comments: Patient is able to fully dorsiflex and plantarflex.  No isolated tenderness at the base of the fifth metatarsal.  No visible discoloration or deformity of the foot or toes.  He does have some mild tenderness along the proximal aspect of the 1st metatarsal  (right).    Skin:    General: Skin is warm and dry.  Neurological:     General: No focal deficit present.     Mental Status: He is alert and oriented to person, place, and time. Mental status is at baseline.    ED Results / Procedures / Treatments   Labs (all labs ordered are listed, but only abnormal results are displayed) Labs Reviewed - No data to display  EKG None  Radiology DG Foot Complete Right  Result Date: 07/14/2021 CLINICAL DATA:  Pain great toe.  Injury last night EXAM: RIGHT FOOT COMPLETE - 3+ VIEW COMPARISON:  None. FINDINGS: There is no evidence of fracture or dislocation. There is no evidence of arthropathy or other focal bone abnormality. Soft tissues are unremarkable. IMPRESSION: Negative. Electronically Signed   By: 07/16/2021 M.D.   On: 07/14/2021 11:16    Procedures Procedures    Medications Ordered in ED Medications - No data to display  ED Course/ Medical Decision Making/ A&P                           Medical Decision Making  Patient  is here with a right foot injury from stubbing it.  X-rays ordered and reviewed personally, showing no acute fracture.  I do not see evidence of Jones fracture, and otherwise do not see evidence of midfoot dislocation or Lisfranc injury.  There is no discoloration of the foot and overall his pain appears to be well controlled with minimal medications.  We can try postop shoe to see if it alleviates some pressure.  I do think is reasonable for him to be weightbearing, we will provide a work note for a week for light duties.  I advised conservative management for 7 days with rest, ice, ibuprofen and Tylenol as needed.  I reported if he is still not able to bear full weight in a week, he can make a follow-up appointment with an orthopedic doctor.  He verbalized understanding and agreement        Final Clinical Impression(s) / ED Diagnoses Final diagnoses:  Right foot pain    Rx / DC Orders ED Discharge Orders      None         Jannetta Massey, Kermit Balo, MD 07/14/21 [phone]

## 2021-09-08 ENCOUNTER — Emergency Department (HOSPITAL_BASED_OUTPATIENT_CLINIC_OR_DEPARTMENT_OTHER)
Admission: EM | Admit: 2021-09-08 | Discharge: 2021-09-08 | Discharge status: Against Medical Advice | Payer: Self-pay | Attending: Emergency Medicine | Admitting: Emergency Medicine

## 2021-09-08 ENCOUNTER — Encounter (HOSPITAL_BASED_OUTPATIENT_CLINIC_OR_DEPARTMENT_OTHER): Payer: Self-pay | Admitting: Urology

## 2021-09-08 ENCOUNTER — Other Ambulatory Visit: Payer: Self-pay

## 2021-09-08 DIAGNOSIS — R110 Nausea: Secondary | ICD-10-CM | POA: Insufficient documentation

## 2021-09-08 DIAGNOSIS — Z5321 Procedure and treatment not carried out due to patient leaving prior to being seen by health care provider: Secondary | ICD-10-CM | POA: Insufficient documentation

## 2021-09-08 DIAGNOSIS — R197 Diarrhea, unspecified: Secondary | ICD-10-CM | POA: Insufficient documentation

## 2021-09-08 NOTE — ED Triage Notes (Signed)
Pt states N/V/D x 2 days  ?No vomiting today  ?States feels better today  ?Denies fever  ? ?

## 2021-09-08 NOTE — ED Notes (Signed)
Called for room with no response 

## 2022-10-26 ENCOUNTER — Other Ambulatory Visit: Payer: Self-pay

## 2022-10-26 ENCOUNTER — Encounter (HOSPITAL_BASED_OUTPATIENT_CLINIC_OR_DEPARTMENT_OTHER): Payer: Self-pay

## 2022-10-26 ENCOUNTER — Emergency Department (HOSPITAL_BASED_OUTPATIENT_CLINIC_OR_DEPARTMENT_OTHER)
Admission: EM | Admit: 2022-10-26 | Discharge: 2022-10-26 | Disposition: A | Discharge status: Home / Self Care | Payer: Self-pay | Attending: Emergency Medicine | Admitting: Emergency Medicine

## 2022-10-26 DIAGNOSIS — F419 Anxiety disorder, unspecified: Secondary | ICD-10-CM | POA: Insufficient documentation

## 2022-10-26 MED ORDER — HYDROXYZINE HCL 50 MG PO TABS: 25.0000 mg | ORAL_TABLET | Freq: Four times a day (QID) | ORAL | 0 refills | Status: AC | PRN

## 2022-10-26 MED ORDER — HYDROXYZINE HCL 25 MG PO TABS
50.0000 mg | ORAL_TABLET | Freq: Once | ORAL | Status: AC
  Administered 2022-10-26: 50 mg via ORAL
  Filled 2022-10-26: qty 2

## 2022-10-26 NOTE — Discharge Instructions (Signed)
You came to the emergency department today with anxiety.  I am starting you on a medication called hydroxyzine.  Please take it as prescribed.  You may take 2 tablets at night if it helps you sleep.  Ultimately, you will need to follow-up with your PCP.  They can send you to a cardiologist if they see fit.  There are multiple offices on these papers for you to call.  It was a pleasure to meet you and I hope you feel better!

## 2022-10-26 NOTE — ED Provider Notes (Signed)
[place] EMERGENCY DEPARTMENT AT [facility] Provider Note   CSN: [id number] Arrival date & time: 10/26/22  1749     History  Chief Complaint  Patient presents with   Anxiety    Gavin Peterson is a 32 y.o. male presenting today with concern for anxiety.  He reports 1 to 2 weeks ago he had multiple shrooms with his wife.  Ever since then he has been having intermittent palpitations and anxiety.  Does not have a PCP.   Anxiety       Home Medications Prior to Admission medications   Medication Sig Start Date End Date Taking? Authorizing Provider  hydrOXYzine (ATARAX) 50 MG tablet Take 0.5 tablets (25 mg total) by mouth every 6 (six) hours as needed. 10/26/22  Yes Shashwat Cleary A, PA-C  HYDROcodone-acetaminophen (NORCO) 5-325 MG tablet Take 1 tablet by mouth every 6 (six) hours as needed for severe pain. 10/18/19   Molpus, John, MD  naproxen (NAPROSYN) 375 MG tablet Take 1 tablet twice daily as needed for back pain. 10/18/19   Molpus, Jonny Ruiz, MD      Allergies    Patient has no known allergies.    Review of Systems   Review of Systems  Physical Exam Updated Vital Signs BP (!) 163/88 (BP Location: Right Arm)   Pulse 60   Temp 98 F (36.7 C) (Oral)   Resp 20   Ht 5\' 11"  (1.803 m)   Wt 108.9 kg   SpO2 99%   BMI 33.47 kg/m  Physical Exam Vitals and nursing note reviewed.  Constitutional:      Appearance: Normal appearance.  HENT:     Head: Normocephalic and atraumatic.  Eyes:     General: No scleral icterus.    Conjunctiva/sclera: Conjunctivae normal.  Cardiovascular:     Rate and Rhythm: Normal rate and regular rhythm.  Pulmonary:     Effort: Pulmonary effort is normal. No respiratory distress.  Skin:    Findings: No rash.  Neurological:     Mental Status: He is alert.  Psychiatric:        Mood and Affect: Mood normal.     ED Results / Procedures / Treatments   Labs (all labs ordered are listed, but only abnormal results are displayed) Labs  Reviewed - No data to display  EKG EKG Interpretation  Date/Time:  Tuesday Oct 26 2022 18:16:42 EDT Ventricular Rate:  65 PR Interval:  150 QRS Duration: 88 QT Interval:  402 QTC Calculation: 418 R Axis:   25 Text Interpretation: Normal sinus rhythm Normal ECG No previous ECGs available Confirmed by Alvester Chou [phone]) on 10/26/2022 7:24:11 PM  Radiology No results found.  Procedures Procedures   Medications Ordered in ED Medications  hydrOXYzine (ATARAX) tablet 50 mg (has no administration in time range)    ED Course/ Medical Decision Making/ A&P                             Medical Decision Making Risk Prescription drug management.   32 year old male presenting with anxiety.  He reports that he ate shrooms with his wife and ever since then he has been having intermittent palpitations and anxiety.  Physical exam is reassuring.  Patient was evaluated and remained on a cardiac monitor for over an hour without any arrhythmias, PVCs or PACs.  Physical exam benign.  I suspect patient is having anxiety secondary to the drug use.  Ultimately, he will need  to follow-up with a PCP.  I will start him on hydroxyzine for anxiety and insomnia.  Low suspicion ACS at this time as patient has no risk factors for this.  Considered lab work however do not believe it will be of much benefit at this time.  Patient is mildly hypertensive however I will not start him on any antihypertensives and defer that decision to the patient and PCP after his anxiety is controlled.  He and his wife are agreeable to the plan.   Final Clinical Impression(s) / ED Diagnoses Final diagnoses:  Anxiety    Rx / DC Orders ED Discharge Orders          Ordered    hydrOXYzine (ATARAX) 50 MG tablet  Every 6 hours PRN        10/26/22 2003           Results and diagnoses were explained to the patient. Return precautions discussed in full. Patient had no additional questions and expressed complete  understanding.   This chart was dictated using voice recognition software.  Despite best efforts to proofread,  errors can occur which can change the documentation meaning.     Woodroe Chen 10/26/22 2010    Terald Sleeper, MD 10/26/22 2252

## 2022-10-26 NOTE — ED Triage Notes (Signed)
Patient here POV from Home.  Endorses taking Mushrooms 1 Week ago. Since then the Patients Anxiety and Tachycardia. No N/V.   NAD Noted during Triage. A&Ox4. GCS 15. Ambulatory.

## 2024-01-14 IMAGING — DX DG FOOT COMPLETE 3+V*R*
3 series · 3 of 3 positions shown · non-contrast
Comparison: None.

CLINICAL DATA: Pain great toe.  Injury last night

EXAM:
RIGHT FOOT COMPLETE - 3+ VIEW

[foot ap]
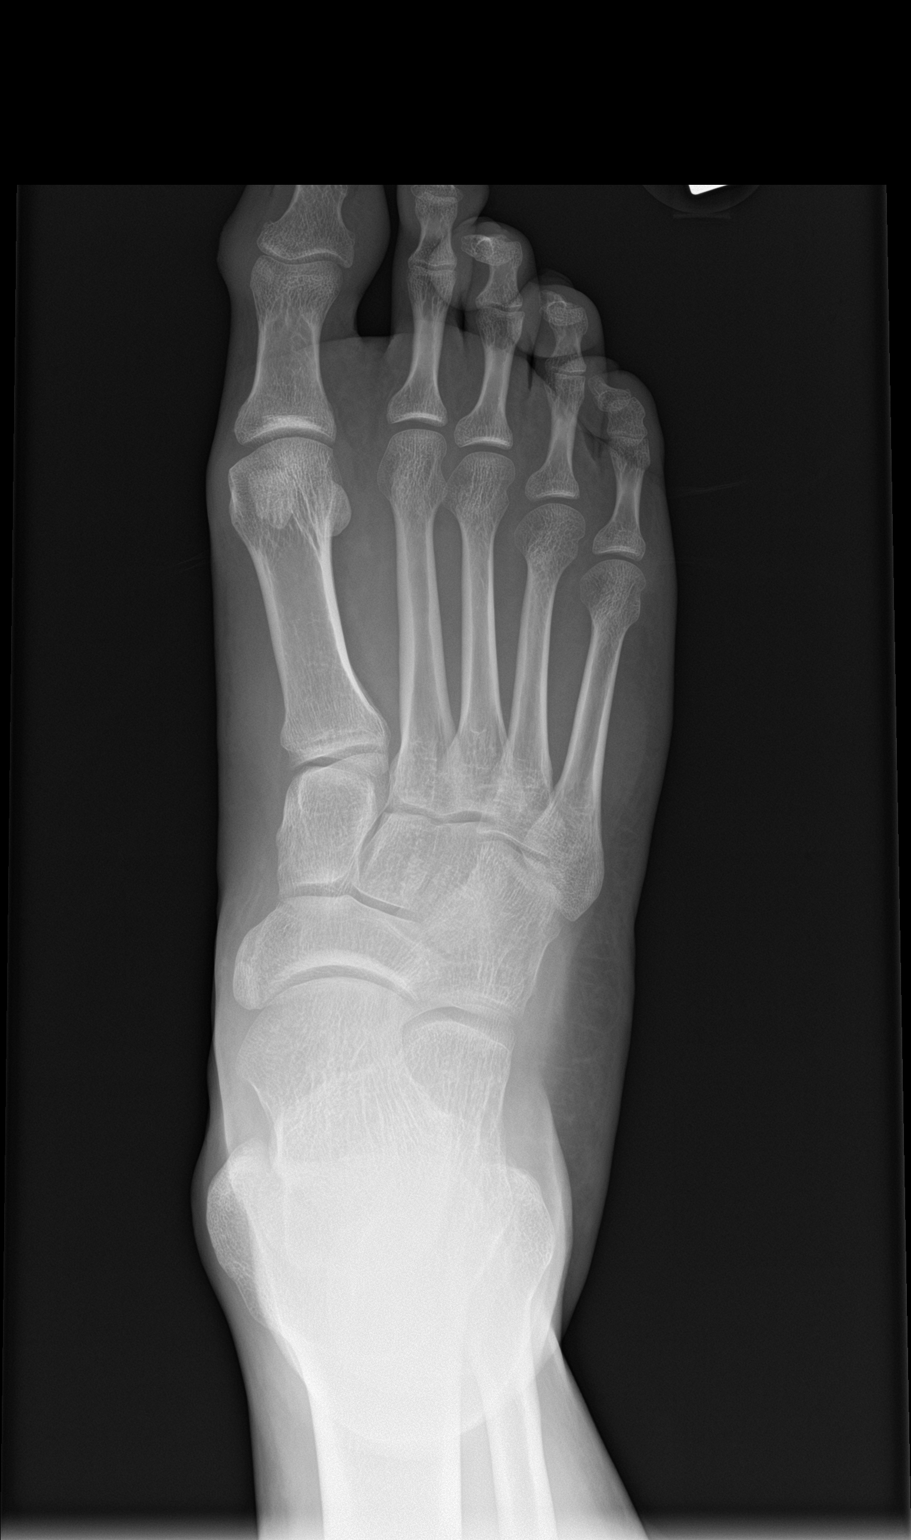

[foot obl]
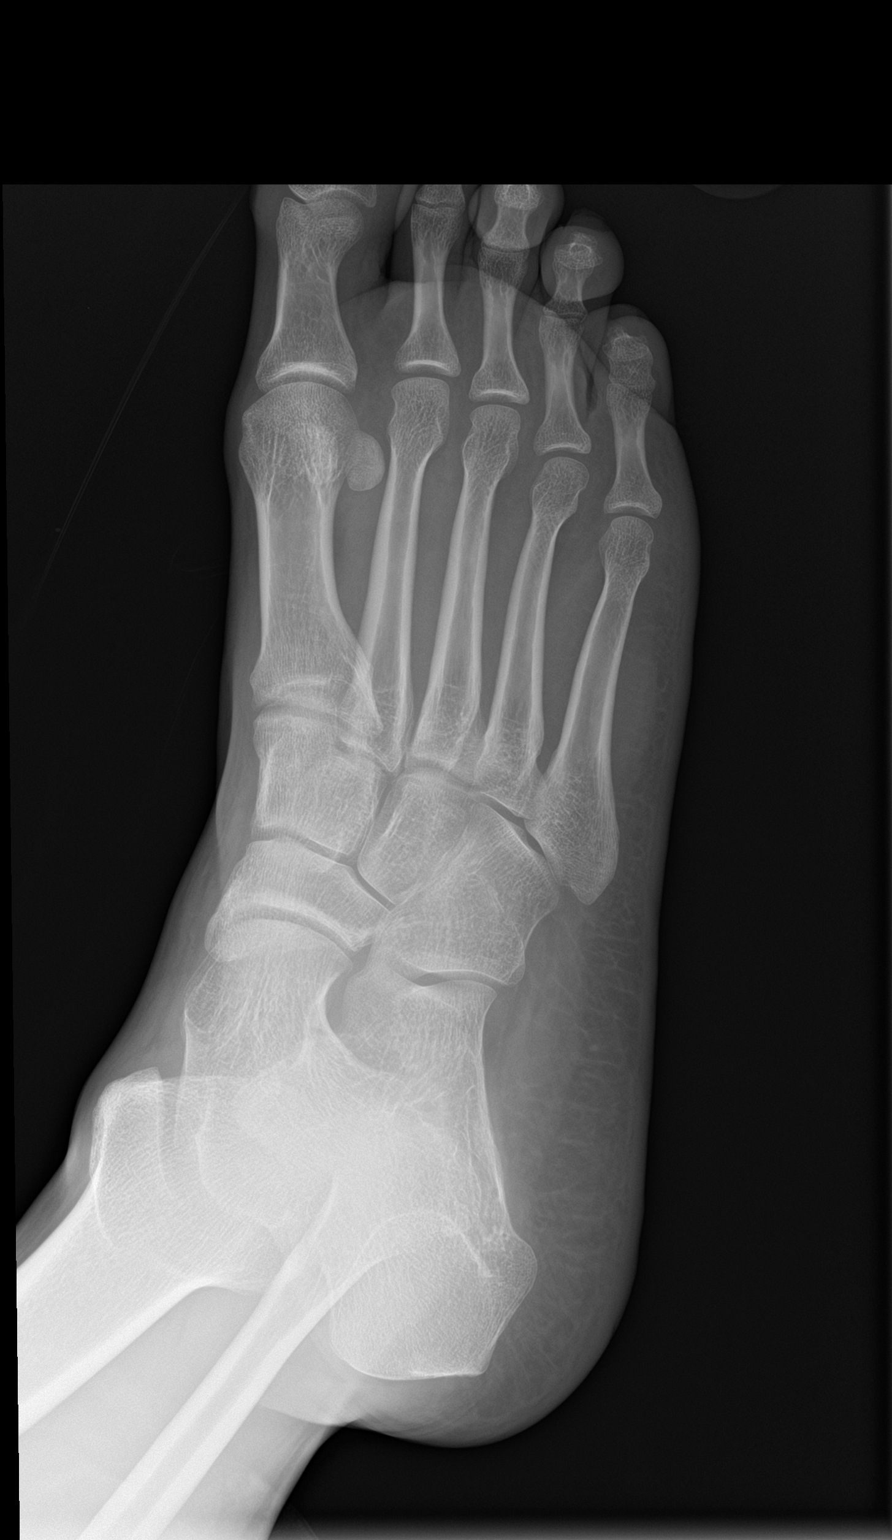

[foot lat]
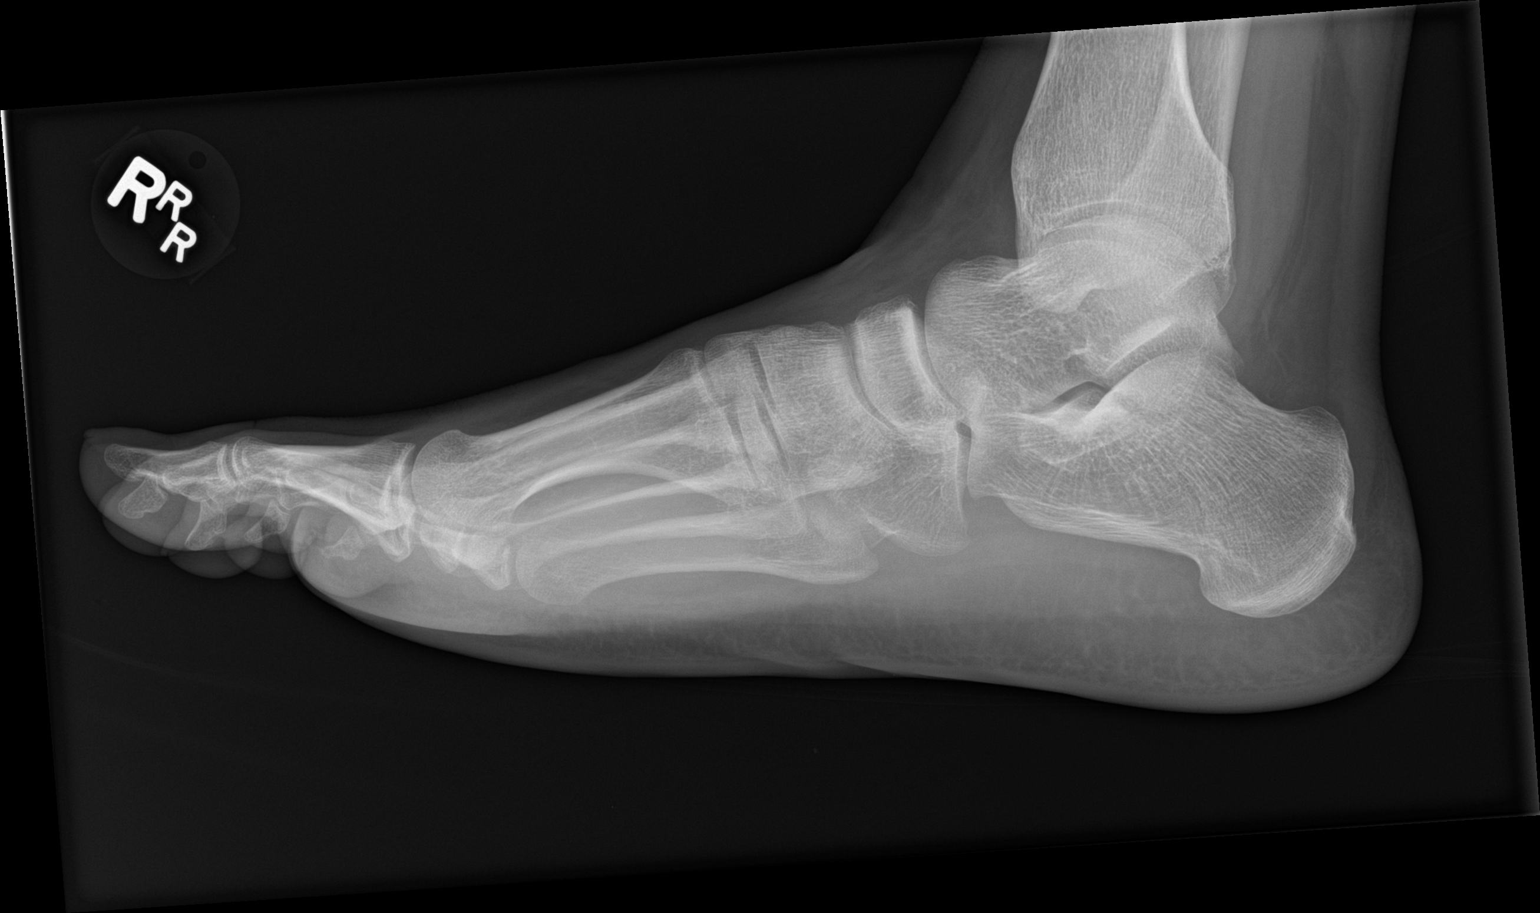

[3 of 3 positions shown; findings below may reference images not displayed]

FINDINGS: There is no evidence of fracture or dislocation. There is no
evidence of arthropathy or other focal bone abnormality. Soft
tissues are unremarkable.
IMPRESSION: Negative.

## 2024-05-21 ENCOUNTER — Other Ambulatory Visit: Payer: Self-pay

## 2024-05-21 ENCOUNTER — Encounter (HOSPITAL_BASED_OUTPATIENT_CLINIC_OR_DEPARTMENT_OTHER): Payer: Self-pay

## 2024-05-21 ENCOUNTER — Emergency Department (HOSPITAL_BASED_OUTPATIENT_CLINIC_OR_DEPARTMENT_OTHER)
Admission: EM | Admit: 2024-05-21 | Discharge: 2024-05-21 | Disposition: A | Discharge status: Home / Self Care | Payer: Self-pay | Attending: Emergency Medicine | Admitting: Emergency Medicine

## 2024-05-21 DIAGNOSIS — S39012A Strain of muscle, fascia and tendon of lower back, initial encounter: Secondary | ICD-10-CM

## 2024-05-21 MED ORDER — KETOROLAC TROMETHAMINE 30 MG/ML IJ SOLN
30.0000 mg | Freq: Once | INTRAMUSCULAR | Status: AC
  Administered 2024-05-21: 30 mg via INTRAMUSCULAR
  Filled 2024-05-21: qty 1

## 2024-05-21 MED ORDER — HYDROMORPHONE HCL 1 MG/ML IJ SOLN
2.0000 mg | Freq: Once | INTRAMUSCULAR | Status: AC
  Administered 2024-05-21: 2 mg via INTRAMUSCULAR
  Filled 2024-05-21: qty 2

## 2024-05-21 MED ORDER — METHOCARBAMOL 500 MG PO TABS: 1000.0000 mg | ORAL_TABLET | Freq: Three times a day (TID) | ORAL | 0 refills | Status: AC | PRN

## 2024-05-21 NOTE — ED Provider Notes (Signed)
 [place] EMERGENCY DEPARTMENT AT [facility] Provider Note   CSN: [id number] Arrival date & time: 05/21/24  2001     Patient presents with: Back Pain   Gavin Peterson is a 33 y.o. male.   HPI 33 year old male presents with severe back pain.  He had a little bit of discomfort for several days before he lifted a heavy couch 2 days ago.  While lifting it he felt a sudden pain in his low back on the right side.  Has had severe pain since then.  Has been trying to treated with bedrest without much help.  The pain is currently severe and primarily in his right lumbar back and a little bit into his hip.  Does not radiate down his legs.  No bowel or bladder incontinence.  No fevers, direct trauma, weight loss, abdominal pain.  Prior to Admission medications   Medication Sig Start Date End Date Taking? Authorizing Provider  methocarbamol  (ROBAXIN ) 500 MG tablet Take 2 tablets (1,000 mg total) by mouth every 8 (eight) hours as needed for up to 5 days for muscle spasms. 05/21/24 05/26/24 Yes Freddi Hamilton, MD  HYDROcodone -acetaminophen  (NORCO) 5-325 MG tablet Take 1 tablet by mouth every 6 (six) hours as needed for severe pain. 10/18/19   Molpus, John, MD  hydrOXYzine  (ATARAX ) 50 MG tablet Take 0.5 tablets (25 mg total) by mouth every 6 (six) hours as needed. 10/26/22   Redwine, Madison A, PA-C  naproxen  (NAPROSYN ) 375 MG tablet Take 1 tablet twice daily as needed for back pain. 10/18/19   Molpus, John, MD    Allergies: Patient has no known allergies.    Review of Systems  Gastrointestinal:  Negative for abdominal pain.  Musculoskeletal:  Positive for back pain.  Neurological:  Negative for weakness and numbness.    Updated Vital Signs BP (!) 150/88   Pulse 62   Temp 97.9 F (36.6 C)   Resp 18   Ht 5' 11 (1.803 m)   Wt 108.9 kg   SpO2 100%   BMI 33.47 kg/m   Physical Exam Vitals and nursing note reviewed.  Constitutional:      Appearance: He is well-developed.  HENT:      Head: Normocephalic and atraumatic.  Cardiovascular:     Rate and Rhythm: Normal rate and regular rhythm.     Pulses:          Dorsalis pedis pulses are 2+ on the right side and 2+ on the left side.  Pulmonary:     Effort: Pulmonary effort is normal.  Abdominal:     Palpations: Abdomen is soft.     Tenderness: There is no abdominal tenderness.  Musculoskeletal:     Lumbar back: Tenderness present.       Back:  Skin:    General: Skin is warm and dry.  Neurological:     Mental Status: He is alert.     Comments: Grossly normal sensation in both lower legs.  Normal foot/ankle movement bilaterally but very limited testing of his lower leg strength as it induces such severe pain when he tries to move his legs.     (all labs ordered are listed, but only abnormal results are displayed) Labs Reviewed - No data to display  EKG: None  Radiology: No results found.   Procedures   Medications Ordered in the ED  HYDROmorphone  (DILAUDID ) injection 2 mg (2 mg Intramuscular Given 05/21/24 2042)  ketorolac  (TORADOL ) 30 MG/ML injection 30 mg (30 mg Intramuscular Given  05/21/24 2043)                                    Medical Decision Making Risk Prescription drug management.   Patient appears to have a low back muscular strain.  He was given IM Dilaudid  and IM Toradol  and is feeling a lot better.  He is now able to get up and walk without difficulty.  Strength seems intact.  Very low suspicion for a spinal cord emergency.  I do not think imaging is needed, especially given no trauma and no red flags.  Will treat supportively with range of motion exercises, NSAIDs, Tylenol , muscle relaxers.  Low suspicion for intra-abdominal/retroperitoneal emergency.  Will discharge home with PCP follow-up and return precautions.     Final diagnoses:  Acute myofascial strain of lumbar region, initial encounter    ED Discharge Orders          Ordered    methocarbamol  (ROBAXIN ) 500 MG tablet   Every 8 hours PRN        05/21/24 2153               Freddi Hamilton, MD 05/21/24 2227

## 2024-05-21 NOTE — ED Triage Notes (Signed)
 PT to self ambulated to triage c/o lower back pain 10/10 ache in nature x 2 days. Pt states he was moving furniture on Saturday when he felt strain in his lower back and pain has increased since. PT a/o x 4 skin warm dry, VSS NAD PT has full ROM in all 4 extremities. Pt denies CP SOB and any alleviating factors.

## 2024-05-21 NOTE — Discharge Instructions (Addendum)
 Use Ibuprofen  and Tylenol  in alternating fashion. We are also prescribing a muscle relaxer called Robaxin .  Do not drive or operate heavy machinery or combine this with alcohol.  Follow-up with your primary care provider in regards to your back pain, especially if not improving.  If you develop new or worsening back pain, weakness or numbness in your legs, incontinence, fever, or any other new/concerning symptoms then return to the ER.

## 2024-06-01 ENCOUNTER — Encounter (HOSPITAL_BASED_OUTPATIENT_CLINIC_OR_DEPARTMENT_OTHER): Payer: Self-pay

## 2024-06-01 DIAGNOSIS — R42 Dizziness and giddiness: Secondary | ICD-10-CM | POA: Insufficient documentation

## 2024-06-01 LAB — COMPREHENSIVE METABOLIC PANEL WITH GFR
ALT: 28 U/L (ref 0–44)
AST: 22 U/L (ref 15–41)
Albumin: 4.7 g/dL (ref 3.5–5.0)
Alkaline Phosphatase: 122 U/L (ref 38–126)
Anion gap: 13 (ref 5–15)
BUN: 21 mg/dL — ABNORMAL HIGH (ref 6–20)
CO2: 21 mmol/L — ABNORMAL LOW (ref 22–32)
Calcium: 9.9 mg/dL (ref 8.9–10.3)
Chloride: 105 mmol/L (ref 98–111)
Creatinine, Ser: 0.66 mg/dL (ref 0.61–1.24)
GFR, Estimated: >60 mL/min
Glucose, Bld: 138 mg/dL — ABNORMAL HIGH (ref 70–99)
Potassium: 3.6 mmol/L (ref 3.5–5.1)
Sodium: 139 mmol/L (ref 135–145)
Total Bilirubin: 0.3 mg/dL (ref 0.0–1.2)
Total Protein: 7.8 g/dL (ref 6.5–8.1)

## 2024-06-01 LAB — CBC
HCT: 44.3 % (ref 39.0–52.0)
Hemoglobin: 15.6 g/dL (ref 13.0–17.0)
MCH: 30.8 pg (ref 26.0–34.0)
MCHC: 35.2 g/dL (ref 30.0–36.0)
MCV: 87.5 fL (ref 80.0–100.0)
Platelets: 287 K/uL (ref 150–400)
RBC: 5.06 MIL/uL (ref 4.22–5.81)
RDW: 13.2 % (ref 11.5–15.5)
WBC: 16.8 K/uL — ABNORMAL HIGH (ref 4.0–10.5)
nRBC: 0 % (ref 0.0–0.2)

## 2024-06-01 NOTE — ED Triage Notes (Addendum)
 Patient arrives via EMS for dizziness starting about 2 hours ago. Reports it is worse when standing. He says recently he has been taking Robaxin  for back strain and has not been ambulating. Stroke screen negative.

## 2024-06-01 NOTE — ED Notes (Signed)
 Pt requesting IV out. Willing to stay in ER for eval, but would like IV out in the meantime. IV removed.

## 2024-06-02 ENCOUNTER — Emergency Department (HOSPITAL_BASED_OUTPATIENT_CLINIC_OR_DEPARTMENT_OTHER)
Admission: EM | Admit: 2024-06-02 | Discharge: 2024-06-02 | Disposition: A | Payer: Self-pay | Attending: Emergency Medicine | Admitting: Emergency Medicine

## 2024-06-02 DIAGNOSIS — R42 Dizziness and giddiness: Secondary | ICD-10-CM

## 2024-06-02 LAB — URINALYSIS, ROUTINE W REFLEX MICROSCOPIC
Bilirubin Urine: NEGATIVE
Glucose, UA: NEGATIVE mg/dL
Ketones, ur: NEGATIVE mg/dL
Leukocytes,Ua: NEGATIVE
Nitrite: NEGATIVE
Protein, ur: NEGATIVE mg/dL
Specific Gravity, Urine: 1.024 (ref 1.005–1.030)
pH: 5.5 (ref 5.0–8.0)

## 2024-06-02 MED ORDER — SODIUM CHLORIDE 0.9 % IV BOLUS
1000.0000 mL | Freq: Once | INTRAVENOUS | Status: AC
  Administered 2024-06-02: 1000 mL via INTRAVENOUS

## 2024-06-02 NOTE — ED Provider Notes (Signed)
 " [place] EMERGENCY DEPARTMENT AT [facility] Provider Note  CSN: [id number] Arrival date & time: 06/01/24 2049  Chief Complaint(s) Dizziness  HPI Gavin Peterson is a 33 y.o. male who is here today for a period of dizziness when standing.  States that it happened earlier today when he is can ready to go to bed.  He says that after transfer to the emergency room he started feel quite a bit better and is not having any symptoms right now.  Patient had been taking medications for back strain, however has not taken any in the last 1 week.  He denies any fever or chills.  No pain in his chest or shortness of breath.   Past Medical History History reviewed. No pertinent past medical history. There are no active problems to display for this patient.  Home Medication(s) Prior to Admission medications  Medication Sig Start Date End Date Taking? Authorizing Provider  HYDROcodone -acetaminophen  (NORCO) 5-325 MG tablet Take 1 tablet by mouth every 6 (six) hours as needed for severe pain. 10/18/19   Molpus, John, MD  hydrOXYzine  (ATARAX ) 50 MG tablet Take 0.5 tablets (25 mg total) by mouth every 6 (six) hours as needed. 10/26/22   Redwine, Madison A, PA-C  naproxen  (NAPROSYN ) 375 MG tablet Take 1 tablet twice daily as needed for back pain. 10/18/19   Molpus, John, MD                                                                                                                                    Past Surgical History Past Surgical History:  Procedure Laterality Date   WISDOM TOOTH EXTRACTION     Family History History reviewed. No pertinent family history.  Social History Social History[1] Allergies Patient has no known allergies.  Review of Systems Review of Systems  Physical Exam Vital Signs  I have reviewed the triage vital signs BP (!) 166/91 (BP Location: Right Arm)   Pulse 66   Temp 98 F (36.7 C) (Oral)   Resp 18   SpO2 99%   Physical Exam Vitals and nursing note  reviewed.  HENT:     Head: Normocephalic and atraumatic.  Eyes:     Pupils: Pupils are equal, round, and reactive to light.  Cardiovascular:     Rate and Rhythm: Normal rate and regular rhythm.     Pulses: Normal pulses.     Heart sounds: Normal heart sounds.  Pulmonary:     Effort: Pulmonary effort is normal.  Abdominal:     General: Abdomen is flat. There is no distension.     Palpations: Abdomen is soft.     Tenderness: There is no abdominal tenderness.  Musculoskeletal:        General: Normal range of motion.  Skin:    General: Skin is warm.  Neurological:     Mental Status: He is alert.     ED Results and  Treatments Labs (all labs ordered are listed, but only abnormal results are displayed) Labs Reviewed  COMPREHENSIVE METABOLIC PANEL WITH GFR - Abnormal; Notable for the following components:      Result Value   CO2 21 (*)    Glucose, Bld 138 (*)    BUN 21 (*)    All other components within normal limits  CBC - Abnormal; Notable for the following components:   WBC 16.8 (*)    All other components within normal limits  URINALYSIS, ROUTINE W REFLEX MICROSCOPIC - Abnormal; Notable for the following components:   Hgb urine dipstick MODERATE (*)    Bacteria, UA RARE (*)    All other components within normal limits                                                                                                                          Radiology No results found.  Pertinent labs & imaging results that were available during my care of the patient were reviewed by me and considered in my medical decision making (see MDM for details).  Medications Ordered in ED Medications  sodium chloride  0.9 % bolus 1,000 mL (has no administration in time range)                                                                                                                                     Procedures Procedures  (including critical care time)  Medical Decision Making / ED  Course   This patient presents to the ED for concern of lightheadedness since resolved, this involves an extensive number of treatment options, and is a complaint that carries with it a high risk of complications and morbidity.  The differential diagnosis includes orthostatic hypotension, dehydration, near syncopal episode, anemia.  Less likely arrhythmia.  MDM: On exam, patient is well-appearing.  He has normal vital signs.  His EKG shows some ST elevations without reciprocal changes, likely a benign early repull.  No chest pain.  Patient feels fine at this time, is not currently taking any of the sedating medications that he had previously been on for his back strain.  Discussed the patient's blood work with him, he has some mild hemoconcentration with an elevated hemoglobin and white count.  There is no fever, likely nonspecific.  Low suspicion for infection.  Will provide patient with some IV fluids, plan for discharge.  Patient is  agreeable with this plan.   Additional history obtained: -Additional history obtained from family at bedside -External records from outside source obtained and reviewed including: Chart review including previous notes, labs, imaging, consultation notes   Lab Tests: -I ordered, reviewed, and interpreted labs.   The pertinent results include:   Labs Reviewed  COMPREHENSIVE METABOLIC PANEL WITH GFR - Abnormal; Notable for the following components:      Result Value   CO2 21 (*)    Glucose, Bld 138 (*)    BUN 21 (*)    All other components within normal limits  CBC - Abnormal; Notable for the following components:   WBC 16.8 (*)    All other components within normal limits  URINALYSIS, ROUTINE W REFLEX MICROSCOPIC - Abnormal; Notable for the following components:   Hgb urine dipstick MODERATE (*)    Bacteria, UA RARE (*)    All other components within normal limits      EKG no evidence of acute ischemia  EKG Interpretation Date/Time:  Friday June 01 2024 21:02:53 EST Ventricular Rate:  78 PR Interval:  158 QRS Duration:  111 QT Interval:  374 QTC Calculation: 426 R Axis:   59  Text Interpretation: Sinus rhythm Right atrial enlargement ST elevation suggests acute pericarditis Confirmed by Mannie Pac [phone]) on 06/02/2024 2:04:42 AM         Medicines ordered and prescription drug management: Meds ordered this encounter  Medications   sodium chloride  0.9 % bolus 1,000 mL    -I have reviewed the patients home medicines and have made adjustments as needed   Cardiac Monitoring: The patient was maintained on a cardiac monitor.  I personally viewed and interpreted the cardiac monitored which showed an underlying rhythm of: Normal sinus rhythm  Social Determinants of Health:  Factors impacting patients care include: Lack of access to primary care   Reevaluation: After the interventions noted above, I reevaluated the patient and found that they have :improved  Co morbidities that complicate the patient evaluation History reviewed. No pertinent past medical history.    Dispostion: I considered admission for this patient, however with his limited risk factors and reassuring workup he is appropriate for discharge.     Final Clinical Impression(s) / ED Diagnoses Final diagnoses:  Light headedness     @PCDICTATION @     [1]  Social History Tobacco Use   Smoking status: Former    Current packs/day: 0.50    Types: Cigarettes   Smokeless tobacco: Never  Vaping Use   Vaping status: Some Days  Substance Use Topics   Alcohol use: Not Currently   Drug use: Not Currently    Comment: Mushrooms recently. Marijuana Occ.     Mannie Pac T, DO 06/02/24 0209  "

## 2024-06-02 NOTE — Discharge Instructions (Addendum)
 While you were in the emergency room, you had blood work that overall was normal.  I recommend that you drink plenty of water over the next few days.  If your blood pressure was a little bit elevated.  Please follow-up with your primary care doctor for routine management of elevated blood pressure.  If you do not have a primary care doctor, I recommend that you establish care.  In your discharge paperwork there is information on how to find a PCP.
